# Patient Record
Sex: Male | Born: 1975 | Race: White | Hispanic: Yes | Marital: Single | Smoking: Never smoker
Health system: Southern US, Community
[De-identification: ages and names within clinical notes are randomized; demographics above are authoritative.]

---

## 2006-02-10 HISTORY — PX: OTHER SURGICAL HISTORY: SHX169

## 2007-10-08 ENCOUNTER — Observation Stay (HOSPITAL_COMMUNITY): Admission: EM | Admit: 2007-10-08 | Discharge: 2007-10-09 | Payer: Self-pay | Admitting: Emergency Medicine

## 2010-06-25 NOTE — Op Note (Signed)
NAMECORDARRIUS, COAD NO.:  [id number]   MEDICAL RECORD NO.:  [id number]          PATIENT TYPE:  OBV   LOCATION:  5028                         FACILITY:  [facility]   PHYSICIAN:  Madelynn Done, MD  DATE OF BIRTH:  21-Jul-1975   DATE OF PROCEDURE:  10/08/2007  DATE OF DISCHARGE:  10/09/2007                               OPERATIVE REPORT   PREOPERATIVE DIAGNOSES:  1. Left long finger table saw injury with open middle and distal      phalangeal fractures.  2. Left thumb laceration without tendon involvement.   POSTOPERATIVE DIAGNOSES:  1. Left long finger table saw injury with open middle and distal      phalangeal fractures.  2. Left thumb laceration without tendon involvement.   ATTENDING SURGEON:  Sharma Covert IV, MD, who was scrubbed and present  for the entire procedure.   ASSISTANT SURGEON:  None.   PROCEDURES:  1. Left long finger distal interphalangeal joint arthrodesis with K-      wire fixation.  2. Left long finger open treatment of interphalangeal joint fracture      with internal fixation involving the distal interphalangeal joint.  3. Left long finger repair of complex laceration 6.5 cm.  4. Debridement of skin and subcutaneous tissue, left long finger      associated with open fracture.  5. Repair of left thumb laceration 2.5 cm, simple laceration.   TOURNIQUET TIME:  Less than 20 minutes at 250 mmHg.   SURGICAL IMPLANT:  One 0.045 K-wire.   ANESTHESIA:  General via laryngeal mask airway.   SURGICAL INDICATIONS:  Mr. Jeremy Watson is a 35 year old right-hand  dominant gentleman who was at work and sustained an injury to his  nondominant left long finger and thumb from a table saw injury.  The  patient was noted to have an open fracture to the distal and middle  phalanges of the left long finger and soft tissue injury to the thumb.  He was taken to the operating room on the day of injury for the above  procedures.  Risks, benefits, and  alternatives were discussed in detail  with the patient and signed informed consent was obtained.   DESCRIPTION OF PROCEDURE:  The patient was properly identified in the  preoperative holding area and mark with a permanent marker made on the  right hand to indicate correct operative site.  The patient was then  brought back to the operating room and placed supine on the anesthesia  room table where general anesthetic was administered.  The patient  tolerated this well.  A well-padded tourniquet was then placed on the  left brachium and sealed with a 1000 drape.  The left upper extremity  was prepped and draped in normal sterile fashion.  A time-out was  called, the correct sites were identified, and the procedure was then  begun.  Attention was then first turned to the left thumb where  debridement of the skin and subcutaneous tissue with the laceration was  then carried out.  The laceration measured 2.5 cm.  Following the  debridement of the skin, the simple laceration  was then closed with a 4-  0 nylon sutures.  Attention was then turned to the left long finger with  debridement of the skin, subcutaneous tissues, and bone associated with  the open fracture of both the distal phalanx as well as middle phalanx  of the left long finger were then carried out.  The patient had  segmental loss of the articular surface of the DIP joint.  It did not  have any table saw damage, destroyed both the articular surface at the  middle phalanx.  What look was the saw also injured the articular  surface of the distal phalanx and what was left in the articular surface  of the distal phalanx was then denuded with a rongeur.  Open treatment  of the distal phalanx fracture was then carried out as well as the  interphalangeal joint and was felt necessary to use the interphalangeal  joint.  What was left in the cancellus, the bone was then taken back on  the middle phalanx, the cancellus surface, as well as  that of the distal  phalanx and a K-wire was then placed across the DIP joint holding the 2  bones in place.  After the primary arthrodesis and treatment of the open  interphalangeal joint fracture, distal fracture, dislocation.  The wound  was then thoroughly irrigated with thorough irrigation and final  debridement was then carried out.  The laceration extended along the  dorsal ulnar surface of the long finger extending all the way up to the  volar surface.  This complex laceration was then closed measuring  approximately 6.5 cm with simple 4-0 and 5-0 nylon sutures.  Marcaine  0.25% of 10 mL was then infiltrated in the wound.  The tourniquet was  deflated.  There was good perfusion of the tip of the finger.  Following  the procedure, there was some venous congestion at the fingertip, but  the nailbed had good blood flow.  Xeroform dressings were applied to  both fingers.  A sterile compressive dressing was then applied.  The K-  wire was bent and then cut and left out of the skin.  Final radiographs  with the mini C-arm were then taken.  The patient was then placed in a  finger splint and a light soft dressing for the thumb.  He was then  extubated and taken to recovery room in good condition.   RADIOGRAPHS:  A 2-views of the finger did show that K-wire across the  DIP joint maintaining the DIP joint in extension.   POSTOPERATIVE PLAN:  The patient will be admitted overnight for IV  antibiotics and pain control.  He will be seen back in the office in  approximately 3-4 days for wound check to see if his fingertips survive.  It was explained to the family following the case, fingertips may not  survive depending on the vascularity distally giving the soft tissue  injury to the finger.      Madelynn Done, MD  Electronically Signed     FWO/MEDQ  D:  10/08/2007  T:  10/09/2007  Job:  [id number]

## 2016-05-30 ENCOUNTER — Ambulatory Visit (INDEPENDENT_AMBULATORY_CARE_PROVIDER_SITE_OTHER): Payer: Managed Care, Other (non HMO) | Admitting: Family Medicine

## 2016-05-30 VITALS — BP 117/76 | HR 64 | Temp 98.1°F | Resp 16 | Ht 65.5 in | Wt 176.4 lb

## 2016-05-30 DIAGNOSIS — Z0000 Encounter for general adult medical examination without abnormal findings: Secondary | ICD-10-CM | POA: Diagnosis not present

## 2016-05-30 DIAGNOSIS — Z23 Encounter for immunization: Secondary | ICD-10-CM

## 2016-05-30 NOTE — Patient Instructions (Addendum)
IF you received an x-ray today, you will receive an invoice from [facility]. Please contact [facility] at [phone] with questions or concerns regarding your invoice.   IF you received labwork today, you will receive an invoice from [place]. Please contact [facility] at [phone] with questions or concerns regarding your invoice.   Our billing staff will not be able to assist you with questions regarding bills from these companies.  You will be contacted with the lab results as soon as they are available. The fastest way to get your results is to activate your My Chart account. Instructions are located on the last page of this paperwork. If you have not heard from [place] regarding the results in 2 weeks, please contact this office.      Mantenimiento de Engineer, civil (consulting) hombres (Health Maintenance, Male) Un estilo de vida saludable y los cuidados preventivos son importantes para la salud y Counsellor. Pregntele al mdico cul es el cronograma de exmenes peridicos adecuado para usted. QU DEBO SABER SOBRE EL PESO Y LA DIETA? Consuma una dieta saludable  Coma muchas verduras, frutas, cereales integrales, productos lcteos con bajo contenido de grasa y Associate Professor.  No consuma muchos alimentos de alto contenido de grasas slidas, azcares agregados o sal. Mantenga un peso saludable La actividad fsica habitual puede ayudarlo a Barista o mantener un peso saludable. Deber hacer lo siguiente:  Realizar al menos de actividad fsica por semana. El ejercicio debe aumentar la frecuencia cardaca y Development worker, international aid la transpiracin (ejercicio de [place]).  Hacer ejercicios de entrenamiento de fuerza por lo [employer] por semana. Controlarse los niveles de colesterol y lpidos en la sangre  Hgase anlisis de sangre para controlar los lpidos y el colesterol cada 5aos a partir de los 35aos. Si tiene un riesgo alto de Warehouse manager cardiopatas  coronarias, debe comenzar a [employer] a los [place]. Es posible que Insurance underwriter los niveles de colesterol con mayor frecuencia si:  Sus niveles de lpidos y colesterol son altos.  Es mayor de 50aos.  Tiene un riesgo alto de tener cardiopatas coronarias. QU DEBO SABER SOBRE LAS PRUEBAS DE DETECCIN DEL CNCER? Muchos tipos de cncer se pueden detectar de manera temprana y a menudo prevenirse. Cncer de pulmn  Debe someterse a pruebas de deteccin de cncer de pulmn todos los aos en los siguientes casos:  Si fuma actualmente y lo ha hecho durante por lo menos 30aos.  Si fue fumador que dej el hbito en el trmino de los ltimos 15aos.  Hable con el mdico sobre las opciones en relacin con los estudios de deteccin, cundo debe comenzar a Actuary y con Engineer, structural. Cncer colorrectal  Generalmente, las pruebas de deteccin habituales del cncer colorrectal comienzan a los 50aos y deben repetirse cada 5 a 10aos hasta los 75aos. Es posible que tenga que hacerse las pruebas con mayor frecuencia si se detectan formas tempranas de plipos precancerosos o pequeos bultos. Sin embargo, el mdico podr aconsejarle que lo haga antes, si tiene factores de riesgo para el cncer de colon.  El mdico puede recomendarle que use kits de prueba caseros para Recruitment consultant oculta en la materia fecal.  Se puede usar una pequea cmara en el extremo de un tubo para examinar el colon (sigmoidoscopia o colonoscopia). Este estudio [employer] formas ms tempranas de Building services engineer. Cncer de prstata y de testculo  En funcin de la edad y del estado de salud general, el mdico  puede realizarle determinados estudios de deteccin del cncer de prstata y de testculo.  Hable con el mdico sobre cualquier sntoma o acerca de las inquietudes que tenga sobre el cncer de prstata o de testculo. Cncer de piel  Revise la piel de la cabeza a los pies con  regularidad.  Informe al mdico si aparecen nuevos lunares o si nota cambios en los que ya tiene, especialmente en estos casos:  Si hay un cambio en el tamao, la forma o el color del lunar.  Si tiene un lunar que es ms grande que el tamao de una goma de Paramedic.  Siempre use pantalla solar. Aplquese pantalla solar de [place] y repetida a lo largo del Futures trader.  Use mangas y [employer], un sombrero de ala ancha y gafas para el sol cuando est al Guadalupe Dawn, para protegerse. QU DEBO SABER SOBRE LAS CARDIOPATAS CORONARIAS, LA DIABETES Y LA HIPERTENSIN ARTERIAL?  Si usted tiene entre 18 y 39aos, debe medirse la presin arterial cada 3a 5aos. Si usted tiene 40aos o ms, debe medirse la presin arterial [employer]. Debe medirse la presin arterial dos veces: una vez cuando est en un hospital o una clnica y la otra vez cuando est en otro sitio. Registre el promedio de [employer]. Para controlar su presin arterial cuando no est en un hospital o Paulita Cradle, puede usar lo siguiente:  Neomia Dear mquina automtica para medir la presin arterial en una farmacia.  Un monitor para medir la presin arterial en el hogar.  Hable con el mdico [employer] ideales de la presin arterial.  Si tiene entre 45 y 79aos, consltele al mdico si debe tomar aspirina para evitar las cardiopatas coronarias.  Hgase anlisis habituales de deteccin de la diabetes; para ello, contrlese la glucemia en ayunas.  Si su peso es normal y tiene un bajo riesgo de padecer diabetes, realcese este anlisis cada tres aos despus de los 45aos.  Si tiene sobrepeso y un alto riesgo de padecer diabetes, considere someterse a este anlisis antes o con mayor frecuencia.  Para los hombres que tienen entre 65 y 31aos, y son o han sido fumadores, se recomienda un nico estudio con ecografa para Engineer, manufacturing un aneurisma de aorta abdominal (AAA). QU DEBO SABER SOBRE LA PREVENCIN DE LAS  INFECCIONES? HepatitisB Si tiene un riesgo ms alto de Primary school teacher hepatitis B, debe someterse a un examen de deteccin de este virus. Hable con el mdico para determinar si corre riesgo de tener una infeccin por hepatitisB. Hepatitis C Se recomienda un anlisis de [place] para:  Todos los que nacieron entre 1946 y [phone].  Todas las personas que tengan un riesgo de haber contrado hepatitis C. Enfermedades de transmisin sexual (ETS)  Debe realizarse pruebas de Airline pilot de las ETS todos los aos, incluidas la gonorrea y la clamidia, en estos casos:  Es sexualmente activo y es menor de [place].  Es mayor de 24aos, y Public affairs consultant informa que corre riesgo de tener este tipo de infecciones.  La actividad sexual ha cambiado desde que le hicieron la ltima prueba de deteccin y tiene un riesgo mayor de Warehouse manager clamidia o Copy. Pregntele al mdico si usted tiene riesgo.  Consulte a su mdico para saber si tiene un alto riesgo de infectarse por el VIH. El mdico puede recomendarle un medicamento de venta con receta para ayudar a evitar la infeccin por el VIH. QU MS PUEDO HACER?  Realcese los estudios de rutina de [address], [address]  y de la vista.  Mantngase al da con las vacunas (inmunizaciones).  No consuma ningn producto que contenga tabaco, lo que incluye cigarrillos, tabaco de Theatre manager y Administrator, Civil Service. Si necesita ayuda para dejar de fumar, consulte al mdico.  Limite el consumo de alcohol a no ms de por da. [employer] a 12 onzas de cerveza, 5onzas de vino o 1onzas de bebidas alcohlicas de alta graduacin.  No consuma drogas.  No comparta agujas.  Solicite ayuda a su mdico si necesita apoyo o informacin para abandonar las drogas.  Informe a su mdico si a menudo se siente deprimido.  Notifique a su mdico si alguna vez ha sido vctima de abuso o si no se siente seguro en su hogar. Esta informacin no tiene Theme park manager el  consejo del mdico. Asegrese de hacerle al mdico cualquier pregunta que tenga. Document Released: 07/26/2007 Document Revised: 02/17/2014 Document Reviewed: 10/31/2014 Elsevier Interactive Patient Education  2017 [employer].

## 2016-05-30 NOTE — Progress Notes (Signed)
Chief Complaint  Patient presents with  . Annual Exam    for work pt has forms     Subjective:  Jeremy Watson is a 41 y.o. male here for a health maintenance visit.  Patient is new pt  There are no active problems to display for this patient.   History reviewed. No pertinent past medical history.  History reviewed. No pertinent surgical history.   No outpatient prescriptions prior to visit.   No facility-administered medications prior to visit.     No Known Allergies   History reviewed. No pertinent family history.   Health Habits: Dental Exam: up to date Eye Exam: not up to date   Social History   Social History  . Marital status: Married    Spouse name: N/A  . Number of children: N/A  . Years of education: N/A   Occupational History  . Not on file.   Social History Main Topics  . Smoking status: Never Smoker  . Smokeless tobacco: Never Used  . Alcohol use No  . Drug use: Unknown  . Sexual activity: Not on file   Other Topics Concern  . Not on file   Social History Narrative  . No narrative on file   History  Alcohol Use No   History  Smoking Status  . Never Smoker  Smokeless Tobacco  . Never Used   History  Drug use: Unknown     Health Maintenance: See under health Maintenance activity for review of completion dates as well. There is no immunization history for the selected administration types on file for this patient.    Depression Screen-PHQ2/9 Depression screen [facility] 2/9 05/30/2016  Decreased Interest 0  Down, Depressed, Hopeless 0  PHQ - 2 Score 0       Depression Severity and Treatment Recommendations:  0-4= None  5-9= Mild / Treatment: Support, educate to call if worse; return in one month  10-14= Moderate / Treatment: Support, watchful waiting; Antidepressant or Psycotherapy  15-19= Moderately severe / Treatment: Antidepressant OR Psychotherapy  >= 20 = Major depression, severe / Antidepressant AND  Psychotherapy    Review of Systems   Review of Systems  Constitutional: Negative for chills, fever and weight loss.  HENT: Negative for ear pain, hearing loss and tinnitus.   Eyes: Negative for blurred vision and double vision.  Cardiovascular: Negative for chest pain, palpitations and orthopnea.  Gastrointestinal: Negative for abdominal pain, diarrhea, nausea and vomiting.  Genitourinary: Negative for dysuria, frequency and urgency.  Musculoskeletal: Negative for back pain, myalgias and neck pain.  Skin: Negative for itching and rash.  Neurological: Negative for dizziness, tingling, tremors and headaches.  Psychiatric/Behavioral: Negative for depression, substance abuse and suicidal ideas.    See HPI for ROS as well.    Objective:   Vitals:   05/30/16 1113  BP: 117/76  Pulse: 64  Resp: 16  Temp: 98.1 F (36.7 C)  TempSrc: Oral  SpO2: 98%  Weight: 176 lb 6.4 oz (80 kg)  Height: 5' 5.5" (1.664 m)    Body mass index is 28.91 kg/m.  Physical Exam  Constitutional: He is oriented to person, place, and time. He appears well-developed and well-nourished.  HENT:  Head: Normocephalic and atraumatic.  Right Ear: External ear normal.  Left Ear: External ear normal.  Nose: Nose normal.  Mouth/Throat: Oropharynx is clear and moist.  Eyes: Conjunctivae and EOM are normal. Pupils are equal, round, and reactive to light.  Neck: Normal range of motion. Neck supple. No  thyromegaly present.  Cardiovascular: Normal rate, regular rhythm, normal heart sounds and intact distal pulses.   No murmur heard. Pulmonary/Chest: Effort normal and breath sounds normal. No respiratory distress. He has no wheezes. He has no rales.  Abdominal: Soft. Bowel sounds are normal. He exhibits no distension. There is no tenderness. There is no rebound and no guarding.  Musculoskeletal: Normal range of motion. He exhibits no edema.  Neurological: He is alert and oriented to person, place, and time. He has  normal reflexes. No cranial nerve deficit.  Skin: Skin is warm. No rash noted.  Psychiatric: He has a normal mood and affect. His behavior is normal. Judgment and thought content normal.       Assessment/Plan:   Patient was seen for a health maintenance exam.  Counseled the patient on health maintenance issues. Reviewed her health mainteance schedule and ordered appropriate tests (see orders.) Counseled on regular exercise and weight management. Recommend regular eye exams and dental cleaning.   The following issues were addressed today for health maintenance:   Jeremy Watson was seen today for annual exam.  Diagnoses and all orders for this visit:  Encounter for health maintenance examination in adult - discussed screening for colon cancer screening at age 40 - prostate screening pt declined today (no known family history)  -     Basic metabolic panel -     Lipid panel  Need for prophylactic vaccination with combined diphtheria-tetanus-pertussis (DTP) vaccine  Other orders -     Tdap vaccine greater than or equal to 7yo IM    No Follow-up on file.    Body mass index is 28.91 kg/m.:  Discussed the patient's BMI with patient. The BMI body mass index is 28.91 kg/m.     No future appointments.  Patient Instructions       IF you received an x-ray today, you will receive an invoice from [facility] Radiology. Please contact [facility] at [phone] with questions or concerns regarding your invoice.   IF you received labwork today, you will receive an invoice from [place]. Please contact [facility] at [phone] with questions or concerns regarding your invoice.   Our billing staff will not be able to assist you with questions regarding bills from these companies.  You will be contacted with the lab results as soon as they are available. The fastest way to get your results is to activate your My Chart account. Instructions are located on the last page of this  paperwork. If you have not heard from [place] regarding the results in 2 weeks, please contact this office.      Mantenimiento de Engineer, civil (consulting) hombres (Health Maintenance, Male) Un estilo de vida saludable y los cuidados preventivos son importantes para la salud y Counsellor. Pregntele al mdico cul es el cronograma de exmenes peridicos adecuado para usted. QU DEBO SABER SOBRE EL PESO Y LA DIETA? Consuma una dieta saludable  Coma muchas verduras, frutas, cereales integrales, productos lcteos con bajo contenido de grasa y Associate Professor.  No consuma muchos alimentos de alto contenido de grasas slidas, azcares agregados o sal. Mantenga un peso saludable La actividad fsica habitual puede ayudarlo a Barista o mantener un peso saludable. Deber hacer lo siguiente:  Realizar al menos de actividad fsica por semana. El ejercicio debe aumentar la frecuencia cardaca y Development worker, international aid la transpiracin (ejercicio de [place]).  Hacer ejercicios de entrenamiento de fuerza por lo [employer] por semana. Controlarse los niveles de colesterol y lpidos en  la sangre  Hgase anlisis de sangre para controlar los lpidos y el colesterol cada 5aos a partir de los 35aos. Si tiene un riesgo alto de Warehouse manager cardiopatas coronarias, debe comenzar a [employer] de [place] a los [place]. Es posible que Insurance underwriter los niveles de colesterol con mayor frecuencia si:  Sus niveles de lpidos y colesterol son altos.  Es mayor de 50aos.  Tiene un riesgo alto de tener cardiopatas coronarias. QU DEBO SABER SOBRE LAS PRUEBAS DE DETECCIN DEL CNCER? Muchos tipos de cncer se pueden detectar de manera temprana y a menudo prevenirse. Cncer de pulmn  Debe someterse a pruebas de deteccin de cncer de pulmn todos los aos en los siguientes casos:  Si fuma actualmente y lo ha hecho durante por lo menos 30aos.  Si fue fumador que dej el hbito en el trmino de  los ltimos 15aos.  Hable con el mdico sobre las opciones en relacin con los estudios de deteccin, cundo debe comenzar a Actuary y con Engineer, structural. Cncer colorrectal  Generalmente, las pruebas de deteccin habituales del cncer colorrectal comienzan a los 50aos y deben repetirse cada 5 a 10aos hasta los 75aos. Es posible que tenga que hacerse las pruebas con mayor frecuencia si se detectan formas tempranas de plipos precancerosos o pequeos bultos. Sin embargo, el mdico podr aconsejarle que lo haga antes, si tiene factores de riesgo para el cncer de colon.  El mdico puede recomendarle que use kits de prueba caseros para Recruitment consultant oculta en la materia fecal.  Se puede usar una pequea cmara en el extremo de un tubo para examinar el colon (sigmoidoscopia o colonoscopia). Este estudio [employer] formas ms tempranas de Building services engineer. Cncer de prstata y de testculo  En funcin de la edad y del [place] de salud general, el mdico puede realizarle determinados estudios de deteccin del cncer de prstata y de testculo.  Hable con el mdico sobre cualquier sntoma o acerca de las inquietudes que tenga sobre el cncer de prstata o de testculo. Cncer de piel  Revise la piel de la cabeza a los pies con regularidad.  Informe al mdico si aparecen nuevos lunares o si nota cambios en los que ya tiene, especialmente en estos casos:  Si hay un cambio en el tamao, la forma o el color del lunar.  Si tiene un lunar que es ms grande que el tamao de una goma de Paramedic.  Siempre use pantalla solar. Aplquese pantalla solar de [place] y repetida a lo largo del Futures trader.  Use mangas y [employer] Data, un sombrero de ala ancha y gafas para el sol cuando est al [place], para protegerse. QU DEBO SABER SOBRE LAS CARDIOPATAS CORONARIAS, LA DIABETES Y LA HIPERTENSIN ARTERIAL?  Si usted tiene entre 18 y 39aos, debe medirse la presin arterial cada 3a 5aos.  Si usted tiene 40aos o ms, debe medirse la presin arterial [employer]. Debe medirse la presin arterial dos veces: una vez cuando est en un hospital o una clnica y la otra vez cuando est en otro sitio. Registre el promedio de [employer]. Para controlar su presin arterial cuando no est en un hospital o Paulita Cradle, puede usar lo siguiente:  Neomia Dear mquina automtica para medir la presin arterial en una farmacia.  Un monitor para medir la presin arterial en el hogar.  Hable con el mdico [employer] ideales de la presin arterial.  Si tiene entre 45 y 79aos, consltele al mdico si debe tomar  aspirina para evitar las cardiopatas coronarias.  Hgase anlisis habituales de deteccin de la diabetes; para ello, contrlese la glucemia en ayunas.  Si su peso es normal y tiene un bajo riesgo de padecer diabetes, realcese este anlisis cada tres aos despus de los 45aos.  Si tiene sobrepeso y un alto riesgo de padecer diabetes, considere someterse a este anlisis antes o con mayor frecuencia.  Para los hombres que tienen entre 65 y 71aos, y son o han sido fumadores, se recomienda un nico estudio con ecografa para Engineer, manufacturing un aneurisma de aorta abdominal (AAA). QU DEBO SABER SOBRE LA PREVENCIN DE LAS INFECCIONES? HepatitisB Si tiene un riesgo ms alto de Primary school teacher hepatitis B, debe someterse a un examen de deteccin de este virus. Hable con el mdico para determinar si corre riesgo de tener una infeccin por hepatitisB. Hepatitis C Se recomienda un anlisis de [place] para:  Todos los que nacieron entre 1945 y [phone].  Todas las personas que tengan un riesgo de haber contrado hepatitis C. Enfermedades de transmisin sexual (ETS)  Debe realizarse pruebas de Airline pilot de las ETS todos los aos, incluidas la gonorrea y la clamidia, en estos casos:  Es sexualmente activo y es menor de [place].  Es mayor de 24aos, y Public affairs consultant informa que corre riesgo de  tener este tipo de infecciones.  La actividad sexual ha cambiado desde que le hicieron la ltima prueba de deteccin y tiene un riesgo mayor de Warehouse manager clamidia o Copy. Pregntele al mdico si usted tiene riesgo.  Consulte a su mdico para saber si tiene un alto riesgo de infectarse por el VIH. El mdico puede recomendarle un medicamento de venta con receta para ayudar a evitar la infeccin por el VIH. QU MS PUEDO HACER?  Realcese los estudios de rutina de la salud, dentales y de Wellsite geologist.  Mantngase al da con las vacunas (inmunizaciones).  No consuma ningn producto que contenga tabaco, lo que incluye cigarrillos, tabaco de Theatre manager y Administrator, Civil Service. Si necesita ayuda para dejar de fumar, consulte al mdico.  Limite el consumo de alcohol a no ms de por da. [employer] a 12 onzas de cerveza, 5onzas de vino o 1onzas de bebidas alcohlicas de alta graduacin.  No consuma drogas.  No comparta agujas.  Solicite ayuda a su mdico si necesita apoyo o informacin para abandonar las drogas.  Informe a su mdico si a menudo se siente deprimido.  Notifique a su mdico si alguna vez ha sido vctima de abuso o si no se siente seguro en su hogar. Esta informacin no tiene Theme park manager el consejo del mdico. Asegrese de hacerle al mdico cualquier pregunta que tenga. Document Released: 07/26/2007 Document Revised: 02/17/2014 Document Reviewed: 10/31/2014 Elsevier Interactive Patient Education  2017 [employer].

## 2016-05-31 LAB — LIPID PANEL
CHOLESTEROL TOTAL: 217 mg/dL — AB (ref 100–199)
Chol/HDL Ratio: 4.3 ratio (ref 0.0–5.0)
HDL: 51 mg/dL (ref >39)
LDL CALC: 130 mg/dL — AB (ref 0–99)
TRIGLYCERIDES: 180 mg/dL — AB (ref 0–149)
VLDL CHOLESTEROL CAL: 36 mg/dL (ref 5–40)

## 2016-05-31 LAB — BASIC METABOLIC PANEL
BUN/Creatinine Ratio: 13 (ref 9–20)
BUN: 10 mg/dL (ref 6–24)
CALCIUM: 9.2 mg/dL (ref 8.7–10.2)
CHLORIDE: 98 mmol/L (ref 96–106)
CO2: 24 mmol/L (ref 18–29)
CREATININE: 0.8 mg/dL (ref 0.76–1.27)
GFR calc Af Amer: 127 mL/min/{1.73_m2} (ref >59)
GFR calc non Af Amer: 110 mL/min/{1.73_m2} (ref >59)
GLUCOSE: 85 mg/dL (ref 65–99)
Potassium: 4.1 mmol/L (ref 3.5–5.2)
Sodium: 137 mmol/L (ref 134–144)

## 2016-06-03 ENCOUNTER — Encounter: Payer: Self-pay | Admitting: Family Medicine

## 2017-07-16 ENCOUNTER — Ambulatory Visit: Payer: BLUE CROSS/BLUE SHIELD | Admitting: Adult Health

## 2017-07-16 ENCOUNTER — Encounter: Payer: Self-pay | Admitting: Family Medicine

## 2017-07-16 ENCOUNTER — Encounter: Payer: Self-pay | Admitting: Adult Health

## 2017-07-16 VITALS — BP 120/70 | Temp 98.2°F | Wt 183.0 lb

## 2017-07-16 DIAGNOSIS — K4090 Unilateral inguinal hernia, without obstruction or gangrene, not specified as recurrent: Secondary | ICD-10-CM | POA: Diagnosis not present

## 2017-07-16 DIAGNOSIS — Z114 Encounter for screening for human immunodeficiency virus [HIV]: Secondary | ICD-10-CM

## 2017-07-16 DIAGNOSIS — Z0000 Encounter for general adult medical examination without abnormal findings: Secondary | ICD-10-CM

## 2017-07-16 LAB — CBC WITH DIFFERENTIAL/PLATELET
BASOS PCT: 2.2 % (ref 0.0–3.0)
Basophils Absolute: 0.1 10*3/uL (ref 0.0–0.1)
EOS PCT: 2.9 % (ref 0.0–5.0)
Eosinophils Absolute: 0.1 10*3/uL (ref 0.0–0.7)
HCT: 45.9 % (ref 39.0–52.0)
Hemoglobin: 16.3 g/dL (ref 13.0–17.0)
LYMPHS ABS: 1.8 10*3/uL (ref 0.7–4.0)
Lymphocytes Relative: 41.5 % (ref 12.0–46.0)
MCHC: 35.4 g/dL (ref 30.0–36.0)
MCV: 88.7 fl (ref 78.0–100.0)
MONOS PCT: 8.6 % (ref 3.0–12.0)
Monocytes Absolute: 0.4 10*3/uL (ref 0.1–1.0)
NEUTROS ABS: 1.9 10*3/uL (ref 1.4–7.7)
NEUTROS PCT: 44.8 % (ref 43.0–77.0)
PLATELETS: 169 10*3/uL (ref 150.0–400.0)
RBC: 5.18 Mil/uL (ref 4.22–5.81)
RDW: 13.4 % (ref 11.5–15.5)
WBC: 4.3 10*3/uL (ref 4.0–10.5)

## 2017-07-16 LAB — TSH: TSH: 2.29 u[IU]/mL (ref 0.35–4.50)

## 2017-07-16 LAB — LIPID PANEL
Cholesterol: 234 mg/dL — ABNORMAL HIGH (ref 0–200)
HDL: 38.4 mg/dL — AB (ref >39.00)
NonHDL: 195.93
TRIGLYCERIDES: 228 mg/dL — AB (ref 0.0–149.0)
Total CHOL/HDL Ratio: 6
VLDL: 45.6 mg/dL — ABNORMAL HIGH (ref 0.0–40.0)

## 2017-07-16 LAB — BASIC METABOLIC PANEL
BUN: 13 mg/dL (ref 6–23)
CO2: 28 meq/L (ref 19–32)
Calcium: 9.6 mg/dL (ref 8.4–10.5)
Chloride: 102 mEq/L (ref 96–112)
Creatinine, Ser: 0.8 mg/dL (ref 0.40–1.50)
GFR: 112.05 mL/min (ref >60.00)
GLUCOSE: 94 mg/dL (ref 70–99)
POTASSIUM: 3.9 meq/L (ref 3.5–5.1)
SODIUM: 138 meq/L (ref 135–145)

## 2017-07-16 LAB — HEPATIC FUNCTION PANEL
ALK PHOS: 48 U/L (ref 39–117)
ALT: 11 U/L (ref 0–53)
AST: 14 U/L (ref 0–37)
Albumin: 4.8 g/dL (ref 3.5–5.2)
Bilirubin, Direct: 0.1 mg/dL (ref 0.0–0.3)
Total Bilirubin: 0.7 mg/dL (ref 0.2–1.2)
Total Protein: 7.4 g/dL (ref 6.0–8.3)

## 2017-07-16 LAB — LDL CHOLESTEROL, DIRECT: Direct LDL: 144 mg/dL

## 2017-07-16 LAB — HEMOGLOBIN A1C: Hgb A1c MFr Bld: 5.1 % (ref 4.6–6.5)

## 2017-07-16 NOTE — Patient Instructions (Signed)
It was great meeting you today   I will follow up with you regarding your lab work   somone will call you to schedule your ultrasound

## 2017-07-16 NOTE — Progress Notes (Signed)
Patient presents to clinic today to establish care. He is a pleasant 42 year old male who  has no past medical history on file.   Acute Concerns: Establish Care/CPE   Chronic Issues: L Inguinal hernia - has been present for 8 months or so. He reports pain with lifting heavy objects. He feels as though the hernia is getting larger. Has testicular swelling but no pain to testicle. Denies any issues with bowel movement or bruising along abdomen   Health Maintenance: Dental -- Does not do on routine basis  Vision -- Does not do on a routine basis  Immunizations -- Last tetanus shot 4-5 years ago.  Colonoscopy -- UTD  Diet: Diet is poor. Exercise: Does not exercise on a routine basis  History reviewed. No pertinent past medical history.  Past Surgical History:  Procedure Laterality Date  . middle finger amputation  Left 2008   Table saw accident     No current outpatient medications on file prior to visit.   No current facility-administered medications on file prior to visit.     No Known Allergies  Family History  Problem Relation Age of Onset  . High Cholesterol Mother   . Heart disease Maternal Grandfather   . Asthma Paternal Grandfather     Social History   Socioeconomic History  . Marital status: Single    Spouse name: Not on file  . Number of children: Not on file  . Years of education: Not on file  . Highest education level: Not on file  Occupational History  . Not on file  Social Needs  . Financial resource strain: Not on file  . Food insecurity:    Worry: Not on file    Inability: Not on file  . Transportation needs:    Medical: Not on file    Non-medical: Not on file  Tobacco Use  . Smoking status: Never Smoker  . Smokeless tobacco: Never Used  Substance and Sexual Activity  . Alcohol use: No  . Drug use: Never  . Sexual activity: Not on file  Lifestyle  . Physical activity:    Days per week: Not on file    Minutes per session: Not on file   . Stress: Not on file  Relationships  . Social connections:    Talks on phone: Not on file    Gets together: Not on file    Attends religious service: Not on file    Active member of club or organization: Not on file    Attends meetings of clubs or organizations: Not on file    Relationship status: Not on file  . Intimate partner violence:    Fear of current or ex partner: Not on file    Emotionally abused: Not on file    Physically abused: Not on file    Forced sexual activity: Not on file  Other Topics Concern  . Not on file  Social History Narrative   Not married    No kids    He likes to fish     Review of Systems  Constitutional: Negative.   HENT: Negative.   Eyes: Negative.   Respiratory: Negative.   Cardiovascular: Negative.   Gastrointestinal: Negative.   Genitourinary: Negative.   Musculoskeletal: Negative.   Skin: Negative.   Neurological: Negative.   Endo/Heme/Allergies: Negative.   Psychiatric/Behavioral: Negative.   All other systems reviewed and are negative.   BP 120/70 (BP Location: Left Arm)   Temp 98.2 F (36.8  C) (Oral)   Wt 183 lb (83 kg)   BMI 29.99 kg/m   Physical Exam  Constitutional: He is oriented to person, place, and time. He appears well-developed and well-nourished. No distress.  HENT:  Head: Normocephalic and atraumatic.  Right Ear: External ear normal.  Left Ear: External ear normal.  Nose: Nose normal.  Mouth/Throat: Oropharynx is clear and moist. No oropharyngeal exudate.  Eyes: Pupils are equal, round, and reactive to light. Conjunctivae and EOM are normal. Right eye exhibits no discharge. Left eye exhibits no discharge. No scleral icterus.  Neck: Normal range of motion. Neck supple. No JVD present. No tracheal deviation present. No thyromegaly present.  Cardiovascular: Normal rate, regular rhythm, normal heart sounds and intact distal pulses. Exam reveals no gallop and no friction rub.  No murmur heard. Pulmonary/Chest:  Effort normal and breath sounds normal. No stridor. No respiratory distress. He has no wheezes. He has no rales. He exhibits no tenderness.  Abdominal: Soft. Bowel sounds are normal. He exhibits no distension and no mass. There is no tenderness. There is no rebound and no guarding. A hernia is present. Hernia confirmed positive in the right inguinal area. Hernia confirmed negative in the left inguinal area.  Genitourinary: Penis normal. Right testis shows swelling. Right testis shows no mass and no tenderness. Cremasteric reflex is not absent on the right side. Left testis shows no mass, no swelling and no tenderness. Cremasteric reflex is not absent on the left side. Circumcised.  Musculoskeletal: Normal range of motion. He exhibits edema. He exhibits no tenderness or deformity.  Lymphadenopathy:    He has no cervical adenopathy. No inguinal adenopathy noted on the left side.  Neurological: He is alert and oriented to person, place, and time. He displays normal reflexes. No cranial nerve deficit or sensory deficit. He exhibits normal muscle tone. Coordination normal.  Skin: Skin is warm and dry. Capillary refill takes less than 2 seconds. No rash noted. He is not diaphoretic. No erythema. No pallor.  Psychiatric: He has a normal mood and affect. His behavior is normal. Judgment and thought content normal.  Nursing note and vitals reviewed.  Assessment/Plan: 1. Routine general medical examination at a health care facility - Encouraged dental visit and eye exam  - Work on diet and exercise  - Follow up in one year  - US Scrotum; Future - Basic metabolic panel - CBC with Differential/Platelet - Hemoglobin A1c - Hepatic function panel - Lipid panel - TSH  2. Encounter for screening for HIV  - HIV antibody  3. Non-recurrent unilateral inguinal hernia without obstruction or gangrene - Will get US due to testicular swelling  - Likely send to gen surg   Shirline Freesory Aishah Teffeteller, NP

## 2017-07-17 LAB — HIV ANTIBODY (ROUTINE TESTING W REFLEX): HIV: NONREACTIVE

## 2017-07-21 ENCOUNTER — Other Ambulatory Visit: Payer: Self-pay | Admitting: Family Medicine

## 2017-07-21 MED ORDER — ATORVASTATIN CALCIUM 20 MG PO TABS: 20.0000 mg | ORAL_TABLET | Freq: Every day | ORAL | 3 refills | Status: AC

## 2017-07-21 NOTE — Telephone Encounter (Signed)
Sent to the pharmacy by e-scribe. 

## 2017-08-07 ENCOUNTER — Other Ambulatory Visit: Payer: Self-pay | Admitting: Adult Health

## 2017-08-16 ENCOUNTER — Other Ambulatory Visit: Payer: Self-pay | Admitting: Adult Health

## 2017-08-16 DIAGNOSIS — N5089 Other specified disorders of the male genital organs: Secondary | ICD-10-CM

## 2017-08-26 ENCOUNTER — Other Ambulatory Visit: Payer: BLUE CROSS/BLUE SHIELD

## 2017-08-27 ENCOUNTER — Ambulatory Visit
Admission: RE | Admit: 2017-08-27 | Discharge: 2017-08-27 | Disposition: A | Discharge status: Home / Self Care | Payer: BLUE CROSS/BLUE SHIELD | Source: Ambulatory Visit | Attending: Adult Health | Admitting: Adult Health

## 2017-08-27 DIAGNOSIS — R1031 Right lower quadrant pain: Secondary | ICD-10-CM | POA: Diagnosis not present

## 2017-08-27 DIAGNOSIS — N5089 Other specified disorders of the male genital organs: Secondary | ICD-10-CM | POA: Diagnosis not present

## 2017-09-09 ENCOUNTER — Other Ambulatory Visit: Payer: Self-pay | Admitting: Family Medicine

## 2017-09-09 DIAGNOSIS — K4090 Unilateral inguinal hernia, without obstruction or gangrene, not specified as recurrent: Secondary | ICD-10-CM

## 2017-10-15 DIAGNOSIS — K4090 Unilateral inguinal hernia, without obstruction or gangrene, not specified as recurrent: Secondary | ICD-10-CM | POA: Diagnosis not present

## 2017-11-02 DIAGNOSIS — K4090 Unilateral inguinal hernia, without obstruction or gangrene, not specified as recurrent: Secondary | ICD-10-CM | POA: Diagnosis not present

## 2017-12-04 DIAGNOSIS — Z23 Encounter for immunization: Secondary | ICD-10-CM | POA: Diagnosis not present

## 2018-12-08 IMAGING — US US SCROTUM W/ DOPPLER COMPLETE
1 series · 14 of 25 positions shown · non-contrast
Comparison: None.

CLINICAL DATA: Scrotal swelling, right inguinal pain

EXAM:
ULTRASOUND OF SCROTUM
TECHNIQUE: Complete ultrasound examination of the testicles, epididymis, and
other scrotal structures was performed.

[Series 1: us scrotum w/ doppler complete · 0.06mm/px · 14 of 55 slices shown]
[im 1/55]
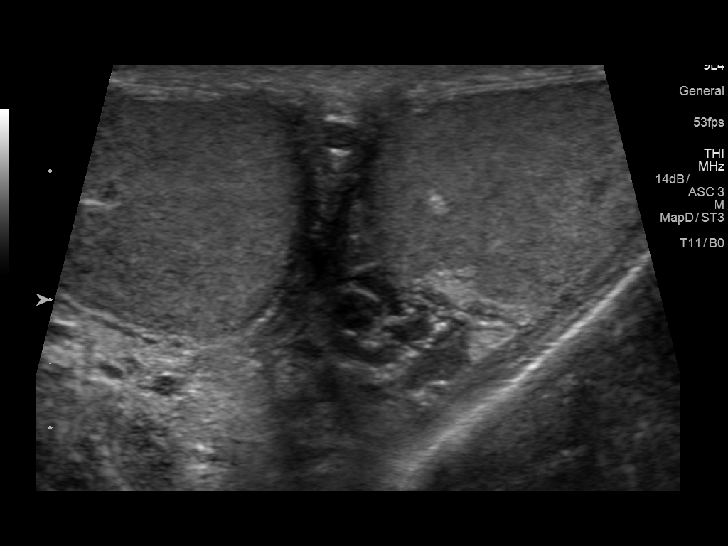
[im 5/55]
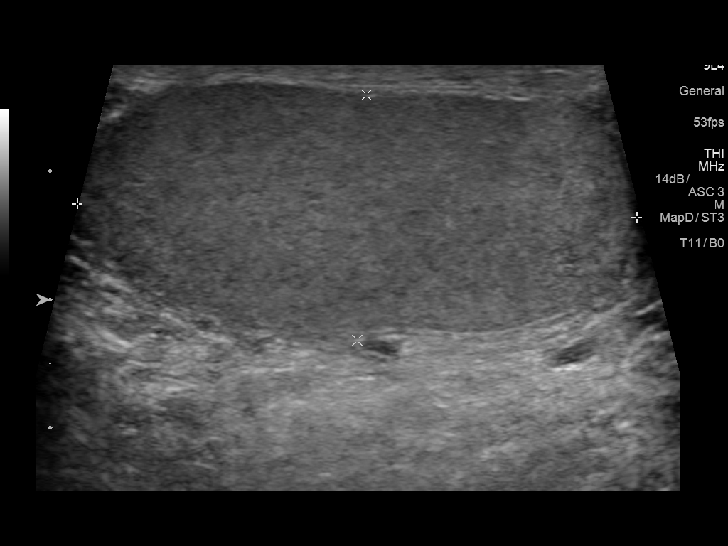
[im 10/55]
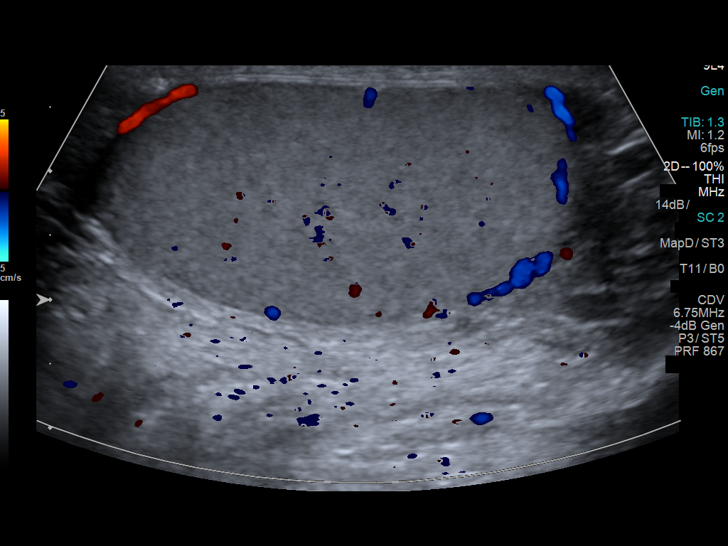
[im 14/55]
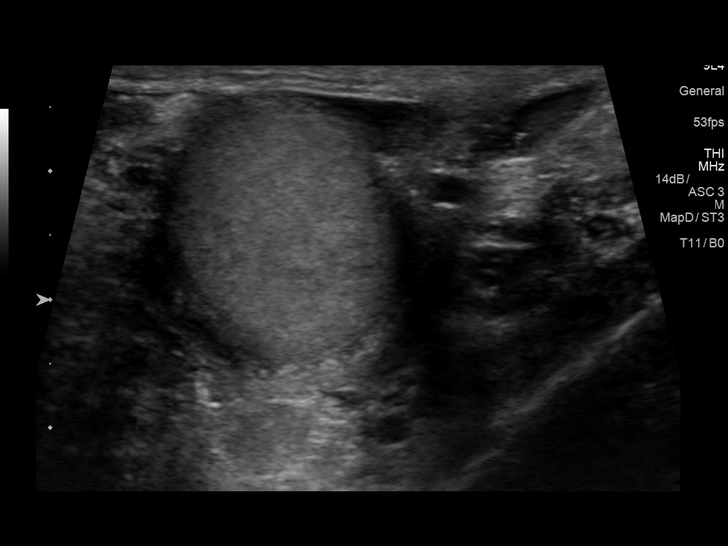
[im 19/55]
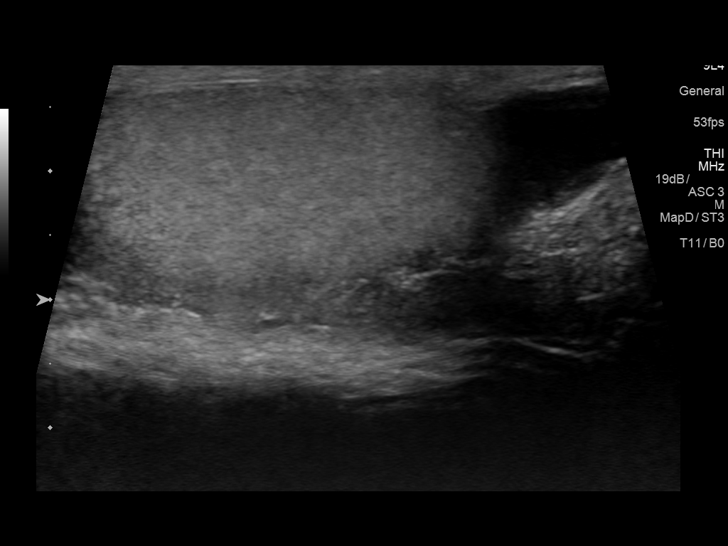
[im 21/55]
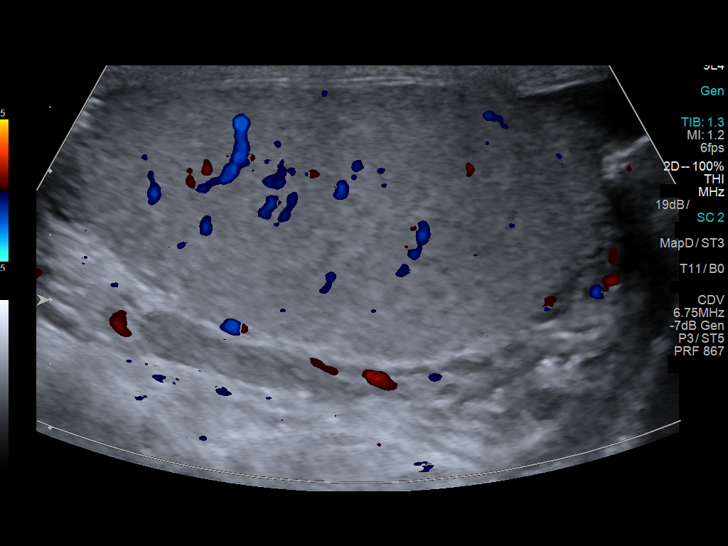
[im 25/55]
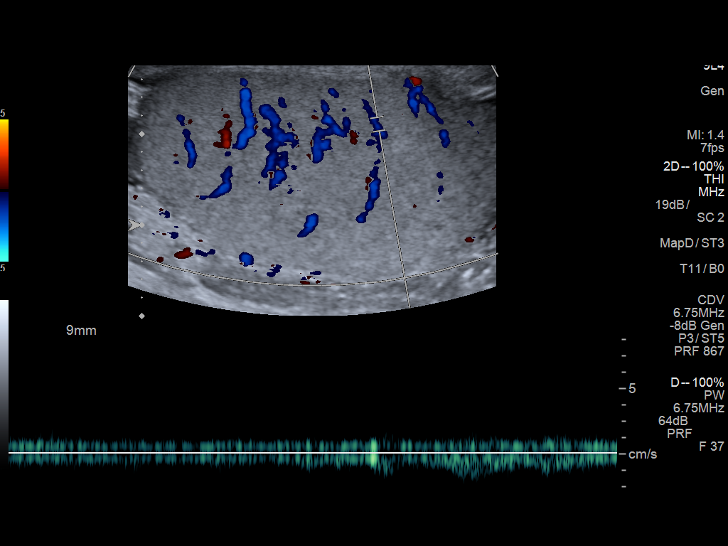
[im 30/55]
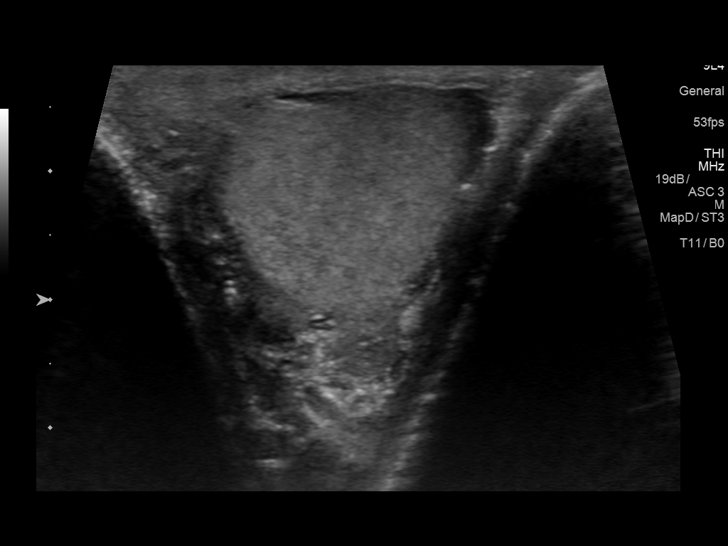
[im 34/55]
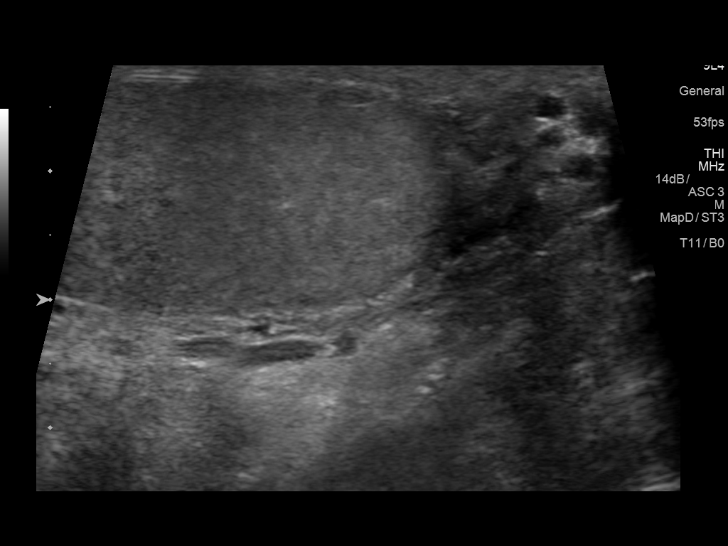
[im 37/55]
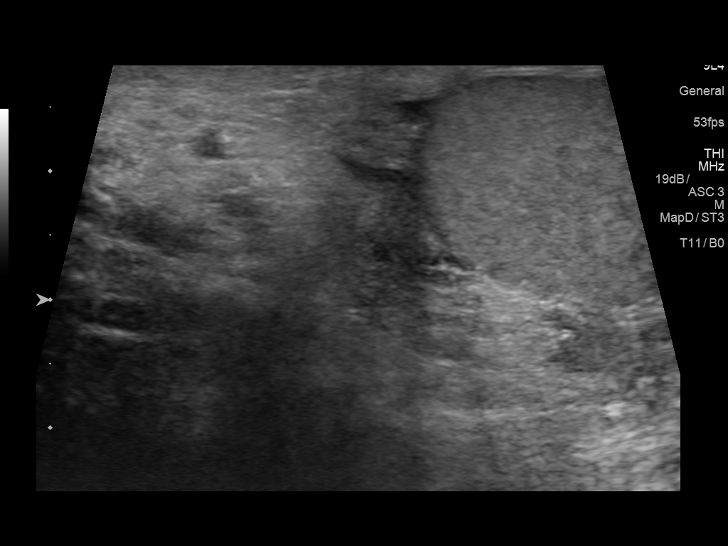
[im 41/55]
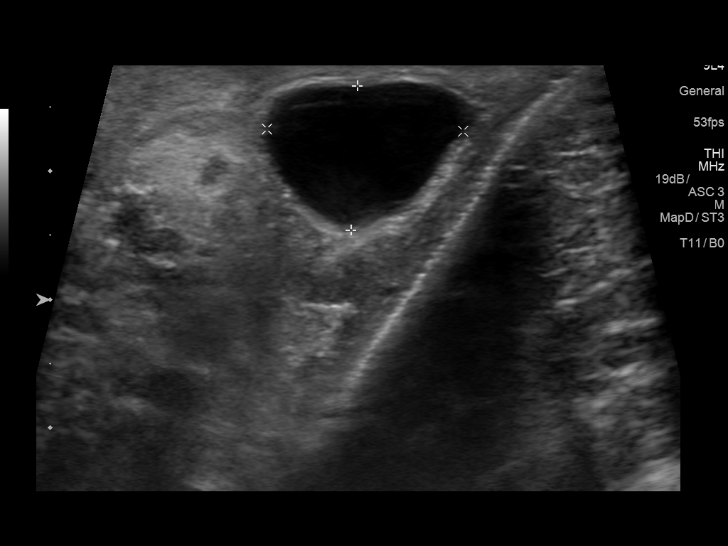
[im 46/55]
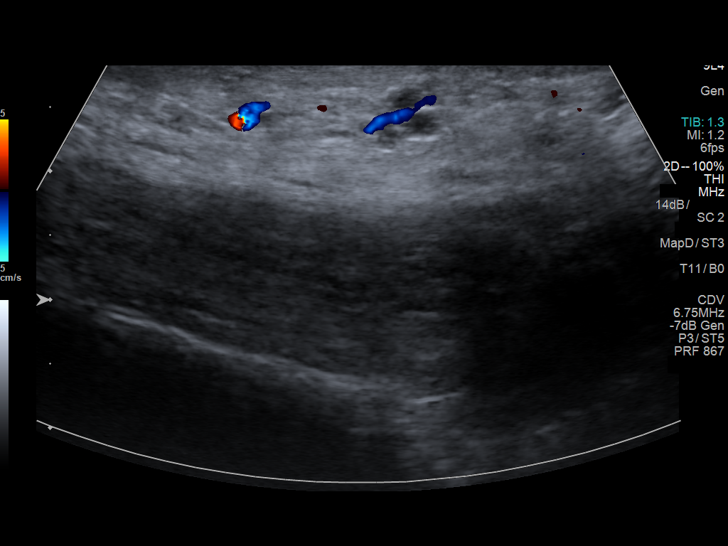
[im 50/55]
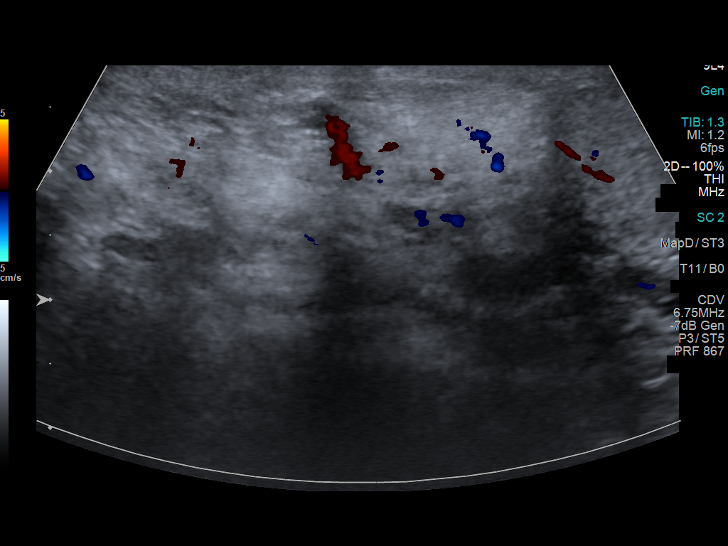
[im 55/55]
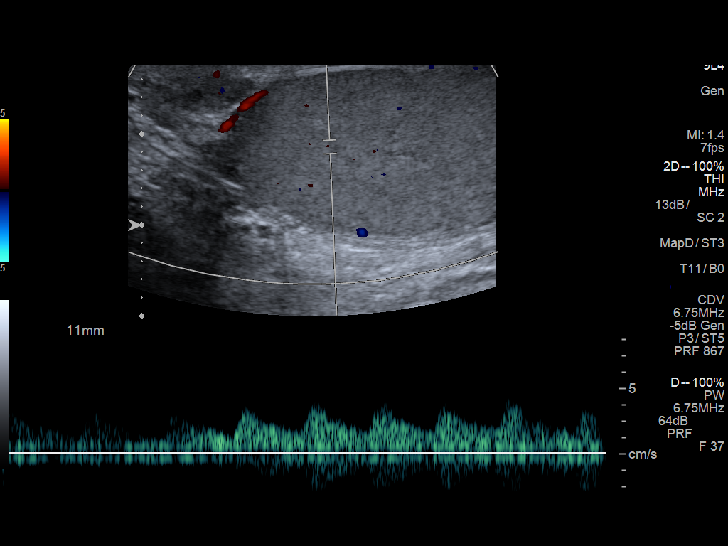

[14 of 25 positions shown; findings below may reference images not displayed]

FINDINGS: Right testicle

Measurements: 4.4 x 1.9 x 2.3 cm. No mass or microlithiasis
visualized.

Left testicle

Measurements: 4.0 x 1.9 x 2.4 cm. No mass or microlithiasis
visualized.

Right epididymis:  Normal in size and appearance.

Left epididymis:  2 cm cyst or spermatocele in the left epididymis.

Hydrocele:  None visualized.

Varicocele:  None visualized.
IMPRESSION: No testicular abnormality.

2 cm left epididymal head cyst or spermatocele.

## 2018-12-08 IMAGING — US US PELVIS LIMITED
1 series · 7 of 7 positions shown · non-contrast
Comparison: None.

CLINICAL DATA: Evaluation of the RIGHT inguinal region. RIGHT
inguinal pain.

EXAM:
LIMITED ULTRASOUND OF PELVIS
TECHNIQUE: Limited transabdominal ultrasound examination of the pelvis was
performed.

[Series 1: us pelvis limited · 0.14mm/px · 7 of 7 slices shown]
[im 1/7]
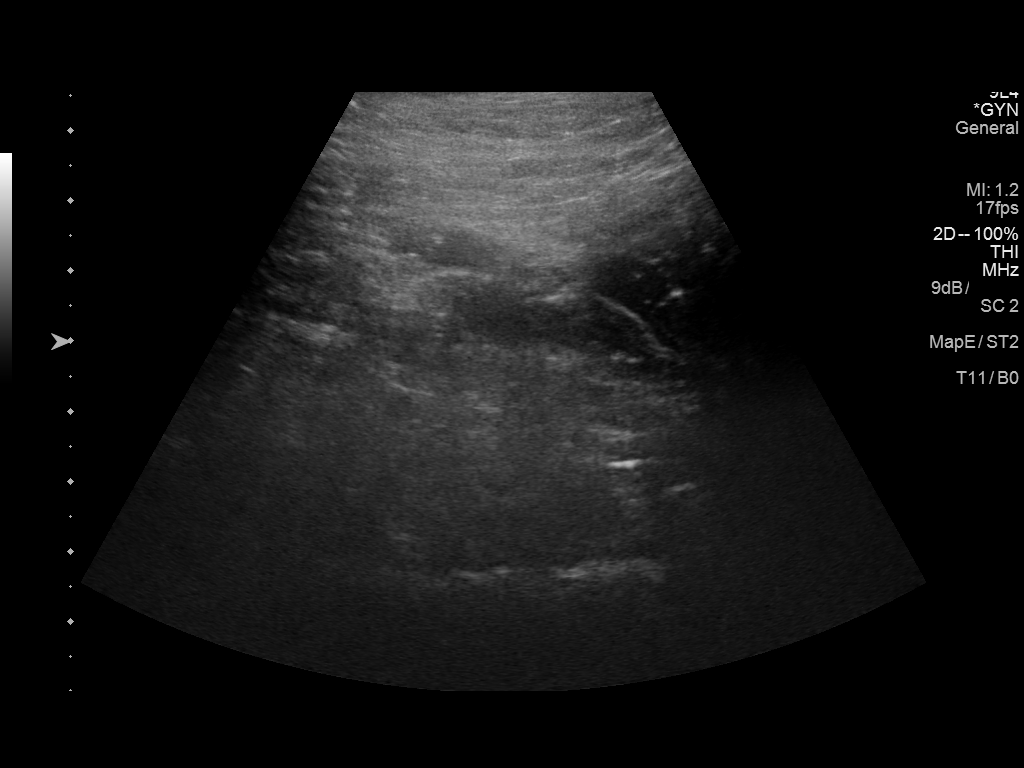
[im 2/7]
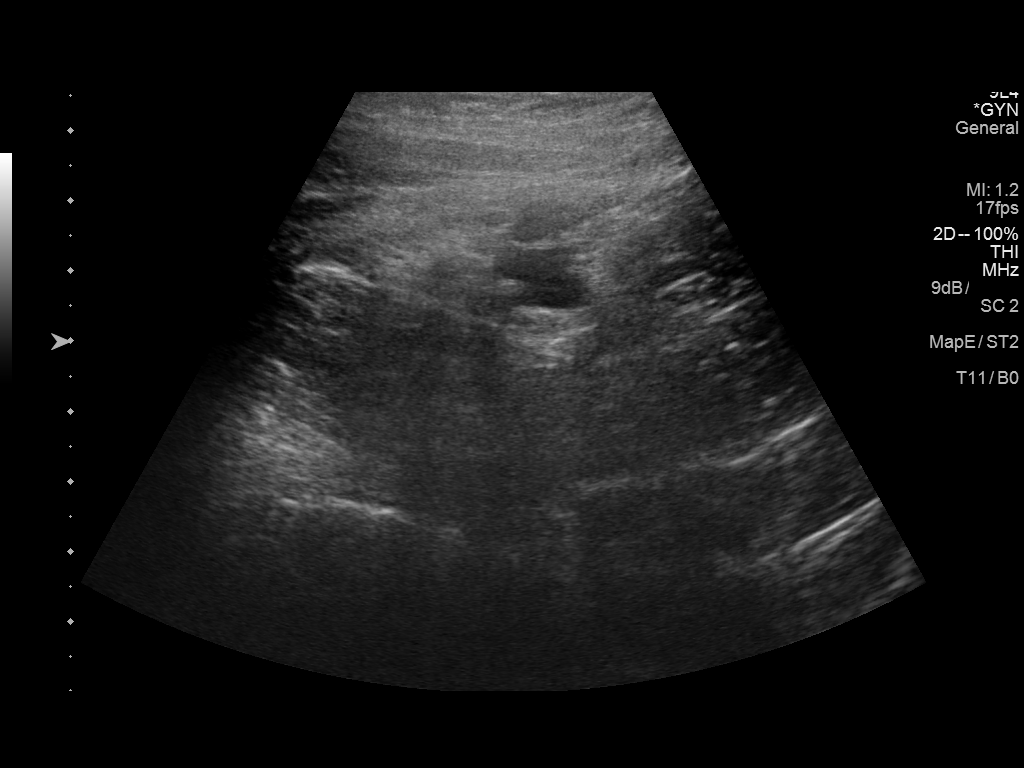
[im 3/7]
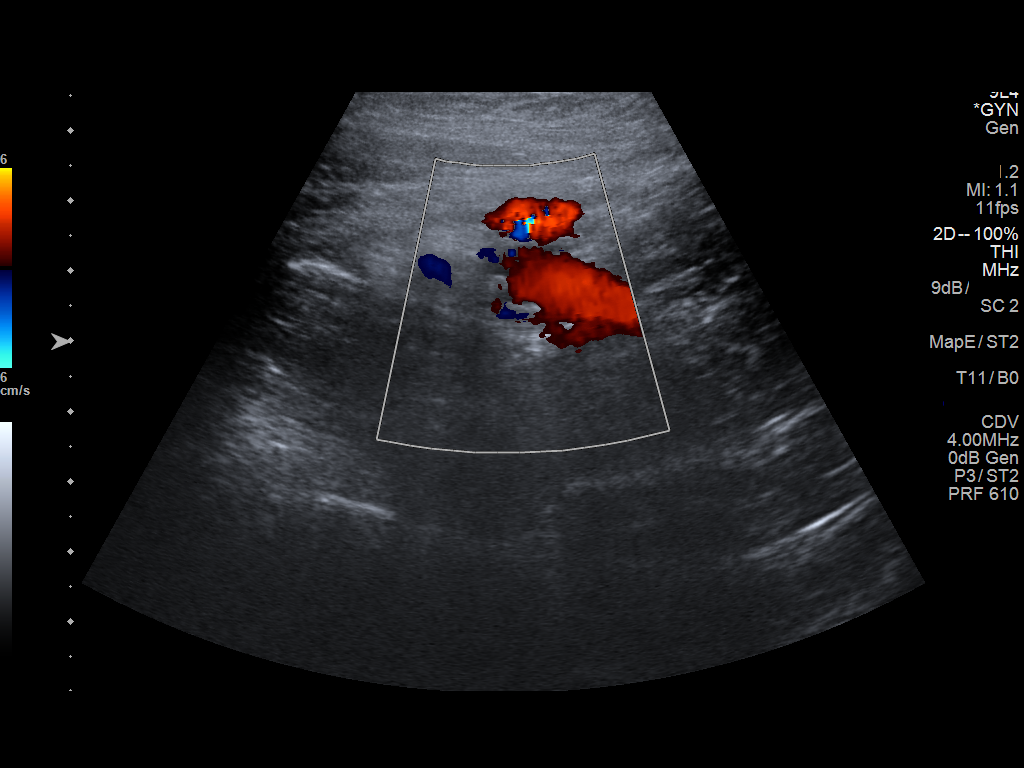
[im 4/7]
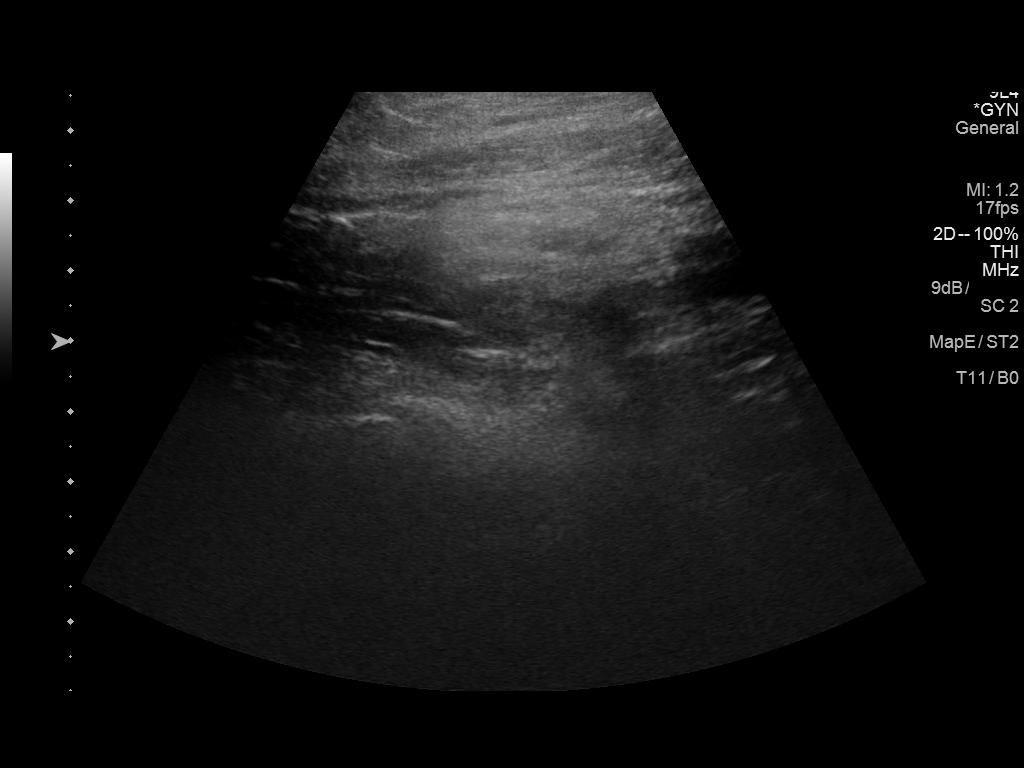
[im 5/7]
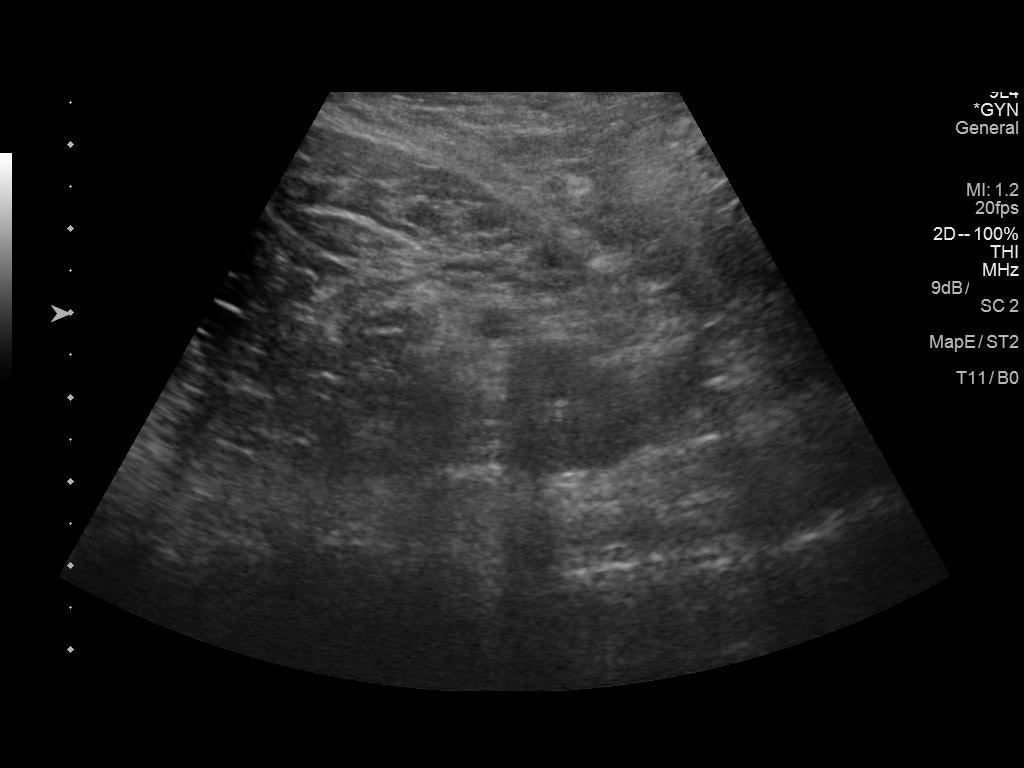
[im 6/7]
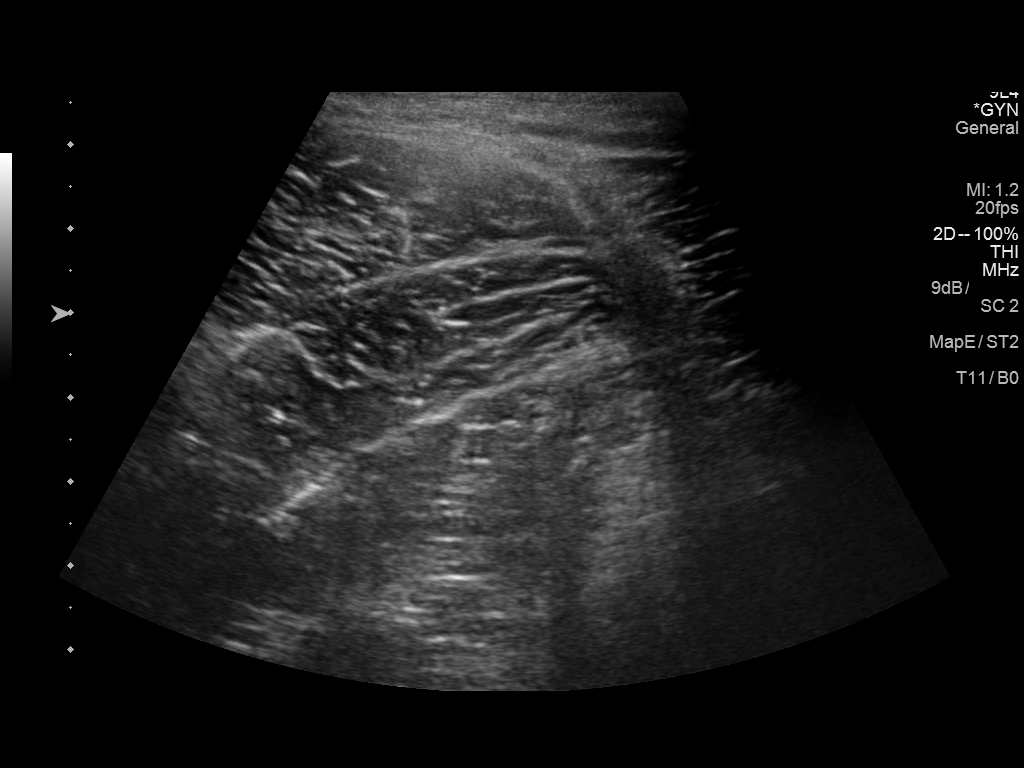
[im 7/7]
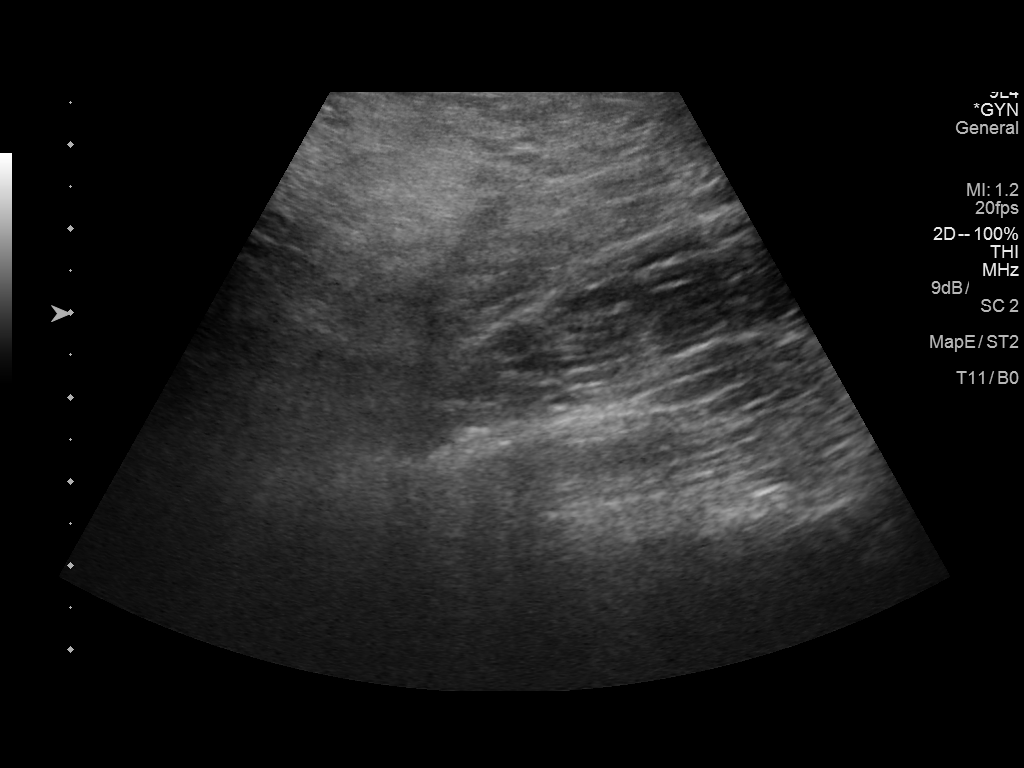

[7 of 7 positions shown; findings below may reference images not displayed]

FINDINGS: No RIGHT inguinal hernia identified. No lymphadenopathy.. No fluid
collections. No undescended testicle.
IMPRESSION: No RIGHT inguinal abnormality by ultrasound.
